# Patient Record
Sex: Male | Born: 1970 | Race: White | Hispanic: No | Marital: Married | Smoking: Former smoker
Health system: Southern US, Community
[De-identification: ages and names within clinical notes are randomized; demographics above are authoritative.]

## PROBLEM LIST (undated history)

## (undated) DIAGNOSIS — K219 Gastro-esophageal reflux disease without esophagitis: Secondary | ICD-10-CM

## (undated) DIAGNOSIS — M5430 Sciatica, unspecified side: Secondary | ICD-10-CM

## (undated) HISTORY — DX: Gastro-esophageal reflux disease without esophagitis: K21.9

## (undated) HISTORY — DX: Sciatica, unspecified side: M54.30

---

## 1998-09-26 HISTORY — PX: LUMBAR DISC SURGERY: SHX700

## 2009-09-26 HISTORY — PX: SHOULDER SURGERY: SHX246

## 2021-08-12 ENCOUNTER — Ambulatory Visit: Payer: Self-pay | Admitting: Orthopaedic Surgery

## 2021-08-26 ENCOUNTER — Other Ambulatory Visit: Payer: Self-pay

## 2021-08-26 ENCOUNTER — Ambulatory Visit: Payer: BC Managed Care – PPO | Admitting: Orthopaedic Surgery

## 2021-08-26 ENCOUNTER — Encounter: Payer: Self-pay | Admitting: Orthopaedic Surgery

## 2021-08-26 ENCOUNTER — Ambulatory Visit: Payer: Self-pay

## 2021-08-26 DIAGNOSIS — M545 Low back pain, unspecified: Secondary | ICD-10-CM

## 2021-08-26 NOTE — Progress Notes (Signed)
Office Visit Note   Patient: Bryce Choi           Date of Birth: [birth year]           MRN: [id number] Visit Date: 08/26/2021              Requested by: No referring provider defined for this encounter. PCP: No primary care provider on file.   Assessment & Plan: Visit Diagnoses:  1. Acute bilateral low back pain, unspecified whether sciatica present   2. Acute bilateral low back pain without sciatica     Plan: Mr. Bryce Choi noted rather cute onset of low back pain 3 to 4 weeks ago after lifting a portable generator.  However several days of being even began to experience some burning in both of his thighs.  His primary care physician placed him on meloxicam on 18 November and he relates that he is feeling much much better.  He is not having any discomfort at this point.  He denies any bowel or bladder changes.  His exam was benign.  Straight leg raise was negative.  Reflexes and motor exam intact.  X-rays reveal some degenerative changes at L4-5 and L5-S1 which I suspect was the cause of his problem.  Long discussion regarding use of meloxicam as needed and trying some exercises.  We will provide him with an exercise sheet.  He is asymptomatic at this point we will plan to see him back as needed  Follow-Up Instructions: Return if symptoms worsen or fail to improve.   Orders:  Orders Placed This Encounter  Procedures   XR Lumbar Spine 2-3 Views   No orders of the defined types were placed in this encounter.     Procedures: No procedures performed   Clinical Data: No additional findings.   Subjective: Chief Complaint  Patient presents with   Lower Back - Pain  Patient presents today for lower back pain. He said that he thinks it all started three weeks after moving a generator. He has been having pain in his lower back on each side. He has also experienced some "burning" in his thigh areas. He said that there has been times he has difficulty standing up straight after resting. He  started taking meloxicam 7.5mg  once daily on 08/13/2021 and has noticed that he is 90% better. He does have history of lower back surgery with Dr.Cowen in 2000.  HPI  Review of Systems   Objective: Vital Signs: There were no vitals taken for this visit.  Physical Exam Constitutional:      Appearance: He is well-developed.  Eyes:     Pupils: Pupils are equal, round, and reactive to light.  Pulmonary:     Effort: Pulmonary effort is normal.  Skin:    General: Skin is warm and dry.  Neurological:     Mental Status: He is alert and oriented to person, place, and time.  Psychiatric:        Behavior: Behavior normal.    Ortho Exam awake alert and oriented x3.  Comfortable sitting in.  No acute distress.  Walks without a limp.  Straight leg raise negative.  Reflexes symmetrical.  Motor exam intact.  Sensory exam intact.  Painless range of motion of both hips.  No percussible tenderness lumbar spine.  Tight hamstrings tendons a little bit tight and could not touch the tips of his fingers to the floor.  Specialty Comments:  No specialty comments available.  Imaging: XR Lumbar Spine 2-3 Views  Result  Date: 08/26/2021 Films lumbar spine were obtained in 2 projections.  There is a very slight left lumbar scoliosis.  There is no evidence of a listhesis on the lateral film.  There is some sclerosis of the L4-5 and L5-S1 facet joints and may be very minimal narrowing of the L5-S1 disc space.  Joints intact.  Very limited AP view of the hips did not demonstrate any arthritic changes.    PMFS History: Patient Active Problem List   Diagnosis Date Noted   Low back pain 08/26/2021   History reviewed. No pertinent past medical history.  History reviewed. No pertinent family history.  History reviewed. No pertinent surgical history. Social History   Occupational History   Not on file  Tobacco Use   Smoking status: Not on file   Smokeless tobacco: Not on file  Substance and Sexual  Activity   Alcohol use: Not on file   Drug use: Not on file   Sexual activity: Not on file

## 2021-09-01 ENCOUNTER — Encounter: Payer: Self-pay | Admitting: Orthopaedic Surgery

## 2021-09-01 ENCOUNTER — Other Ambulatory Visit: Payer: Self-pay

## 2021-09-01 DIAGNOSIS — M545 Low back pain, unspecified: Secondary | ICD-10-CM

## 2021-09-07 ENCOUNTER — Encounter: Payer: Self-pay | Admitting: Orthopaedic Surgery

## 2021-09-08 ENCOUNTER — Other Ambulatory Visit: Payer: Self-pay

## 2021-09-08 ENCOUNTER — Ambulatory Visit (INDEPENDENT_AMBULATORY_CARE_PROVIDER_SITE_OTHER): Payer: BC Managed Care – PPO | Admitting: Orthopaedic Surgery

## 2021-09-08 ENCOUNTER — Encounter: Payer: Self-pay | Admitting: Orthopaedic Surgery

## 2021-09-08 DIAGNOSIS — M545 Low back pain, unspecified: Secondary | ICD-10-CM

## 2021-09-08 NOTE — Progress Notes (Signed)
° °  Office Visit Note   Patient: Bryce Choi           Date of Birth: 11/29/1970           MRN: [id number] Visit Date: 09/08/2021              Requested by: No referring provider defined for this encounter. PCP: No primary care provider on file.   Assessment & Plan: Visit Diagnoses: No diagnosis found.  Plan: Patient is an active healthy 50 year old gentleman who presents today for follow-up on an MRI scan of his lumbar spine.  He has a previous history of Hemi laminectomy L5-S1 on the left and he believes 2000.  He was doing quite well until he was lifting a generator.  Initially he came in he did not have any symptoms at the beginning of December.  Then he had development of some burning MRI was ordered.  He comes in today denies any burning or radicular type symptoms.  He says he has pain focused in the lower central back that radiates out to his hips but goes no further.  He rates his pain at 4-5 out of 10 usually when he stands up after his been sitting a while this is present does not really get any relief or go below a 4 denies any weakness loss of bowel or bladder control.  MRI shows some facet arthropathy at all the levels of the lumbar spine.  He has had injections with Dr. Alvester Morin into his neck and he did very well.  We offered him the option of doing physical therapy or trying possibly either facet injections or epidural steroid injections with Dr. Alvester Morin.  He would like to do this.  We will make the referral  Follow-Up Instructions: No follow-ups on file.   Orders:  No orders of the defined types were placed in this encounter.  No orders of the defined types were placed in this encounter.     Procedures: No procedures performed   Clinical Data: No additional findings.   Subjective: Chief Complaint  Patient presents with   Lower Back - Follow-up    MRI review  Patient presents today for follow up on his lower back. He had an MRI and is here today for those  results.    Review of Systems  All other systems reviewed and are negative.   Objective: Vital Signs: There were no vitals taken for this visit.  Physical Exam Patient is sitting comfortably on the exam table. Ortho Exam Examination of his lumbar spine he has mild tenderness at the distal end of the lumbar spine.  Previous healed surgery and incision.  His strength is 5 out of 5 bilaterally sensation is intact he has a negative straight leg raise bilaterally. Specialty Comments:  No specialty comments available.  Imaging: No results found.   PMFS History: Patient Active Problem List   Diagnosis Date Noted   Low back pain 08/26/2021   No past medical history on file.  No family history on file.  No past surgical history on file. Social History   Occupational History   Not on file  Tobacco Use   Smoking status: Not on file   Smokeless tobacco: Not on file  Substance and Sexual Activity   Alcohol use: Not on file   Drug use: Not on file   Sexual activity: Not on file

## 2021-09-09 ENCOUNTER — Telehealth: Payer: Self-pay | Admitting: Orthopaedic Surgery

## 2021-09-09 NOTE — Telephone Encounter (Signed)
[employer] forms received. To [employer]. 

## 2021-09-13 ENCOUNTER — Encounter: Payer: Self-pay | Admitting: Orthopaedic Surgery

## 2021-09-14 ENCOUNTER — Other Ambulatory Visit: Payer: Self-pay

## 2021-09-14 ENCOUNTER — Encounter: Payer: Self-pay | Admitting: Physical Medicine and Rehabilitation

## 2021-09-14 ENCOUNTER — Encounter: Payer: Self-pay | Admitting: Orthopaedic Surgery

## 2021-09-14 ENCOUNTER — Ambulatory Visit (INDEPENDENT_AMBULATORY_CARE_PROVIDER_SITE_OTHER): Payer: BC Managed Care – PPO | Admitting: Physical Medicine and Rehabilitation

## 2021-09-14 ENCOUNTER — Telehealth: Payer: Self-pay | Admitting: Orthopaedic Surgery

## 2021-09-14 VITALS — BP 134/85 | HR 78

## 2021-09-14 DIAGNOSIS — M5416 Radiculopathy, lumbar region: Secondary | ICD-10-CM

## 2021-09-14 DIAGNOSIS — M545 Low back pain, unspecified: Secondary | ICD-10-CM

## 2021-09-14 NOTE — Progress Notes (Signed)
° °  Numeric Pain Rating Scale and Functional Assessment Average Pain 9 Pain Right Now 3 My pain is intermittent, burning, dull, and aching Pain is worse with: walking, bending, standing, some activites, and laying. Pain improves with: medication   In the last MONTH (on 0-10 scale) has pain interfered with the following?  1. General activity like being  able to carry out your everyday physical activities such as walking, climbing stairs, carrying groceries, or moving a chair?  Rating(7)  2. Relation with others like being able to carry out your usual social activities and roles such as  activities at home, at work and in your community. Rating(8)  3. Enjoyment of life such that you have  been bothered by emotional problems such as feeling anxious, depressed or irritable?  Rating(9)

## 2021-09-14 NOTE — Telephone Encounter (Signed)
Received medical records release form and $25.00 cash/forwarding to [employer] today

## 2021-09-17 NOTE — Telephone Encounter (Signed)
Referral faxed

## 2021-09-22 ENCOUNTER — Telehealth: Payer: Self-pay | Admitting: Orthopaedic Surgery

## 2021-09-22 ENCOUNTER — Encounter: Payer: Self-pay | Admitting: Orthopaedic Surgery

## 2021-09-22 NOTE — Telephone Encounter (Signed)
Refaxed order to [phone]

## 2021-09-22 NOTE — Telephone Encounter (Signed)
Ok for work note indicating no restrictions when he returns

## 2021-09-22 NOTE — Telephone Encounter (Signed)
Cassandra with Dr.Dailey's office called stating a referral was supposed to have been sent for the pt. She states they don't have the referral and would like it faxed please.   Fax# [phone] CB# [phone]

## 2021-09-22 NOTE — Telephone Encounter (Signed)
Please advise if patient will have restrictions upon returning to work 09/27/21. Lift/carry weight limit, push/pull, bend/twist, reach, climb ladder, sit, stand, walk? (I have a work tolerance form). Thanks!

## 2021-10-01 ENCOUNTER — Encounter: Payer: Self-pay | Admitting: Orthopaedic Surgery

## 2021-10-01 ENCOUNTER — Telehealth: Payer: Self-pay | Admitting: Orthopaedic Surgery

## 2021-10-01 NOTE — Telephone Encounter (Signed)
Please advise how long to allow for intermittent leave. Thanks

## 2021-10-04 NOTE — Telephone Encounter (Signed)
Completed and faxed frequency 2 times month lasting 4 days-duration 10/01/21- 03/31/22

## 2021-10-04 NOTE — Telephone Encounter (Signed)
Call patient and discuss length of time out of work either with or without restrictions

## 2021-10-12 ENCOUNTER — Ambulatory Visit: Payer: BC Managed Care – PPO | Admitting: Physician Assistant

## 2021-10-12 ENCOUNTER — Other Ambulatory Visit: Payer: Self-pay

## 2021-10-12 ENCOUNTER — Encounter: Payer: Self-pay | Admitting: Physician Assistant

## 2021-10-12 DIAGNOSIS — M5440 Lumbago with sciatica, unspecified side: Secondary | ICD-10-CM

## 2021-10-12 NOTE — Progress Notes (Signed)
Office Visit Note   Patient: Bryce Choi           Date of Birth: [birth year]           MRN: [id number] Visit Date: 10/12/2021              Requested by: Vanessa [place], FNP [address] [place],  [place] [address] PCP: Vanessa [place], FNP  Chief Complaint  Patient presents with   Lower Back - Pain      HPI: Patient is a pleasant 51 year old gentleman who has been seeing [place] for lower back pain.  He is from [place] and is in the process of getting a referral for epidural steroid injections.  He thinks they are going to call him today.  He actually state he had been doing well and gone back to working out.  He worked out very hard and 2 days later he woke up and had exacerbation of his symptoms.  They were different.  He is complaining of pain in the buttock that radiates down into his lower leg.  No loss of bowel or bladder control.  He finds it very difficult to sit for any period of time.  He states when he rolls over in bed he can relieve the symptoms or they can be exacerbated depending on what he does.  He feels a pulling going all the way down his leg if he tries to stretch.  He also has increased symptoms when he tries to bend over when he drove down here from [place] he did get some tingling in his foot that was relieved when he sat up.  He is wondering if there is something he can be given while he is waiting to get in for an epidural steroid injection.  He has had back surgery in 2006.  Denies any weakness.  Assessment & Plan: Visit Diagnoses: No diagnosis found.  Plan: Patient was told to follow-up if his symptoms progressed to weakness.  In the meantime he will contact us if he does not hear from the radiology group in the next day.  He does not want to take pain medication and he says he is taking oral steroids in the past and could not tolerate them.  We did talk about going forward with an injection of Toradol today to give him some relief.  This was  administered without difficulty We will also provide him with a note to be off work for the next 2 weeks.  He understands if he does not get relief from the injection or his symptoms worsen we will refer him to a spine surgeon Follow-Up Instructions: No follow-ups on file.   Ortho Exam  Patient is alert, oriented, no adenopathy, well-dressed, normal affect, normal respiratory effort. Examination patient are standing to sitting.  He is able to sit he has good plantar flexion dorsiflexion strength.  Sensation is intact.  He does have a positive straight leg raise  Imaging: No results found. No images are attached to the encounter.  Labs: No results found for: HGBA1C, ESRSEDRATE, CRP, LABURIC, REPTSTATUS, GRAMSTAIN, CULT, LABORGA   No results found for: ALBUMIN, PREALBUMIN, CBC  No results found for: MG No results found for: VD25OH  No results found for: PREALBUMIN No flowsheet data found.   There is no height or weight on file to calculate BMI.  Orders:  No orders of the defined types were placed in this encounter.  No orders of the defined types were placed in  this encounter.    Procedures: No procedures performed  Clinical Data: No additional findings.  ROS:  All other systems negative, except as noted in the HPI. Review of Systems  Objective: Vital Signs: There were no vitals taken for this visit.  Specialty Comments:  No specialty comments available.  PMFS History: Patient Active Problem List   Diagnosis Date Noted   Low back pain 08/26/2021   History reviewed. No pertinent past medical history.  History reviewed. No pertinent family history.  History reviewed. No pertinent surgical history. Social History   Occupational History   Not on file  Tobacco Use   Smoking status: Not on file   Smokeless tobacco: Not on file  Substance and Sexual Activity   Alcohol use: Not on file   Drug use: Not on file   Sexual activity: Not on file

## 2021-10-12 NOTE — Progress Notes (Signed)
Toradol (generic) 60 mg (2 cc of 30 mg /mL) given IM right dorsogluteal area.  FY:[employer]: [phone] Exp:12/2022

## 2021-10-16 ENCOUNTER — Encounter: Payer: Self-pay | Admitting: Orthopaedic Surgery

## 2021-10-20 ENCOUNTER — Ambulatory Visit (HOSPITAL_COMMUNITY)
Admission: RE | Admit: 2021-10-20 | Discharge: 2021-10-20 | Disposition: A | Discharge status: Home / Self Care | Payer: BC Managed Care – PPO | Source: Ambulatory Visit | Attending: Orthopaedic Surgery | Admitting: Orthopaedic Surgery

## 2021-10-20 ENCOUNTER — Other Ambulatory Visit: Payer: Self-pay

## 2021-10-20 ENCOUNTER — Encounter: Payer: Self-pay | Admitting: Orthopaedic Surgery

## 2021-10-20 ENCOUNTER — Ambulatory Visit (INDEPENDENT_AMBULATORY_CARE_PROVIDER_SITE_OTHER): Payer: BC Managed Care – PPO | Admitting: Orthopaedic Surgery

## 2021-10-20 DIAGNOSIS — M5440 Lumbago with sciatica, unspecified side: Secondary | ICD-10-CM | POA: Diagnosis present

## 2021-10-20 NOTE — Progress Notes (Signed)
Office Visit Note   Patient: Bryce Choi           Date of Birth: 11/16/1970           MRN: [id number] Visit Date: 10/20/2021              Requested by: Vanessa [place], FNP [address] [place],  [place] [address] PCP: Vanessa [place], FNP   Assessment & Plan: Visit Diagnoses:  1. Acute right-sided low back pain with sciatica, sciatica laterality unspecified     Plan: Mr Mundie has been followed recently for the problem referable to his lumbar spine.  His past history is significant that he had a lumbar laminectomy over 20 years ago for what sounded like a ruptured disc on the left.  He is done well until just recently when he had a problem after lifting a heavy generator.  Initially the pain was localized to his back.  He had an MRI scan that demonstrated some degenerative changes.  There was no evidence of radiculopathy.  He felt better and even returned to work only to have an exacerbation of his pain predominantly with right lower extremity radiculopathy.  He is not had any bowel or bladder changes.  He has had some pain in his right calf and some tingling into his toes.  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  This did not help.  He is also been on gabapentin up to 200 mg at night as he has had trouble sleeping.  He has not noticed any loss of feeling or weakness in his right leg but he does have trouble sleeping.  Sitting is a problem.  HE DOES have some tenderness over the greater trochanter of his right hip and no pain with range of motion of the hip along the groin but definitely has a straight leg raise is positive.  Reflexes are symmetrical and motor or sensory exam intact.  At this point I think we need to repeat the MRI scan as it sure seems that he has nerve root irritation on the right.  I would also urged him to call his pain physician in [place] as I think he needs to  have an epidural steroid injection with if he receives the [facility] before the MRI scan and is better we will cancel the MRI scan release we will try to get approval based on the change of his symptoms that he did not want pain medicines or Medrol Dosepak.  He is not working and has been approved to stay out of work until early February.  He can increase his gabapentin to 300 mg at night.  So far has not had any side effects.  He will continue with the ibuprofen 800 mg 3 times a day as needed with food  Follow-Up Instructions: Return in about 2 weeks (around 11/03/2021).   Orders:  Orders Placed This Encounter  Procedures   MR Lumbar Spine w/o contrast   No orders of the defined types were placed in this encounter.     Procedures: No procedures performed   Clinical Data: No additional findings.   Subjective: Chief Complaint  Patient presents with   Lower Back - Pain  Patient presents today for lower back pain. He is now having right leg pain. He cannot sleep or sit. He saw MaryAnne last week and received a toradol injection. He  said that the toradol did not help. He also saw pain management last Thursday in [place].  His symptoms have changed over the last 10 to 12 days with predominantly right lower extremity discomfort.  He has had some pain in his right thigh and calf and tingling into the right foot.  No bowel or bladder changes.  No symptoms referable to the left lower extremity.  Not having much back pain  HPI  Review of Systems   Objective: Vital Signs: There were no vitals taken for this visit.  Physical Exam Constitutional:      Appearance: He is well-developed.  Eyes:     Pupils: Pupils are equal, round, and reactive to light.  Pulmonary:     Effort: Pulmonary effort is normal.  Skin:    General: Skin is warm and dry.  Neurological:     Mental Status: He is alert and oriented to person, place, and time.  Psychiatric:        Behavior: Behavior normal.    Ortho  Exam awake and alert and oriented x3.  He was in some discomfort particularly when he would sit.  Straight leg raise is negative on the left but it was definitely positive on the right for posterior thigh and calf pain.  Sensory exam was intact but he notes he does have some constant tingling in his right foot.  There was some tenderness over the greater trochanter of his right hip but no masses.  No buttock pain.  No percussible back pain.  Reflexes symmetrical at both knees and I thought decreased in the left ankle but again no symptoms referable to the left lower extremity.  This might be related to his old back surgery.  Motor exam intact  Specialty Comments:  No specialty comments available.  Imaging: No results found.   PMFS History: Patient Active Problem List   Diagnosis Date Noted   Low back pain 08/26/2021   History reviewed. No pertinent past medical history.  History reviewed. No pertinent family history.  History reviewed. No pertinent surgical history. Social History   Occupational History   Not on file  Tobacco Use   Smoking status: Not on file   Smokeless tobacco: Not on file  Substance and Sexual Activity   Alcohol use: Not on file   Drug use: Not on file   Sexual activity: Not on file

## 2021-10-21 NOTE — Telephone Encounter (Signed)
Please extend out of work note until Feb 15

## 2021-10-28 ENCOUNTER — Encounter: Payer: Self-pay | Admitting: Orthopaedic Surgery

## 2021-11-03 ENCOUNTER — Other Ambulatory Visit: Payer: Self-pay

## 2021-11-03 ENCOUNTER — Encounter: Payer: Self-pay | Admitting: Orthopaedic Surgery

## 2021-11-03 ENCOUNTER — Ambulatory Visit (INDEPENDENT_AMBULATORY_CARE_PROVIDER_SITE_OTHER): Payer: BC Managed Care – PPO | Admitting: Orthopaedic Surgery

## 2021-11-03 DIAGNOSIS — M5440 Lumbago with sciatica, unspecified side: Secondary | ICD-10-CM

## 2021-11-03 NOTE — Progress Notes (Signed)
Office Visit Note   Patient: Bryce Choi           Date of Birth: 07-23-1971           MRN: [id number] Visit Date: 11/03/2021              Requested by: Vanessa [place], FNP [address] [place],  [place] [address] PCP: Vanessa [place], FNP   Assessment & Plan: Visit Diagnoses:  1. Acute right-sided low back pain with sciatica, sciatica laterality unspecified     Plan: Mr.Vivero had a second MRI scan of his lumbar spine performed on January 25.  This demonstrated shallow central disc protrusions at L2-3 and L3-4 without stenosis.  There was also shallow central disc protrusion and facet hypertrophy at L4-5 with mild subarticular stenosis bilaterally.  There was a prior left hemilaminectomy at L5-S1 but negative for neural impingement or recurrent disc.  I had the repeat MRI scan because he was experiencing significant back and left lower extremity pain associated with thigh and calf discomfort and tingling into his foot may be a bit of weakness of dorsiflexion of his toes and foot.  In the interim he has been seen by a physician in [place] and had an epidural steroid injection notes that he is at least 60% better.  He does not have the severe pain in his back or his legs but still has a bit of tingling in his toes and he feels like he is still little bit weaker.  Today's straight leg raise was negative.  I thought he was a little bit weaker with dorsiflexion of his foot and toes then on the left side but reflexes were symmetrical except for decreased left ankle reflex which may be result of his old left L5-S1 hemilaminectomy.  Overall he is definitely better.  He does work at [employer] and does a lot of bending stooping squatting and lifting.  I do not think he can do that as yet.  He has a follow-up exam with his [facility] physician in 2 days and might be considered for another epidural steroid injection.  He should continue to stay out of work until he is reevaluated by me over the next  3 to 4 weeks.  Hopefully he will continue to improve but if not I will have him see one of the spine surgeons.  I did offer offered evaluation by Dr. Ophelia Charter.  He is doing well at this point with Advil and Tylenol.  No bowel or bladder changes  Follow-Up Instructions: Return in about 1 month (around 12/01/2021).   Orders:  No orders of the defined types were placed in this encounter.  No orders of the defined types were placed in this encounter.     Procedures: No procedures performed   Clinical Data: No additional findings.   Subjective: Chief Complaint  Patient presents with   Lower Back - Pain, Follow-up  Patient presents today for a two week follow up on his lower back. He had an MRI and got those results over the phone. He states that since his injection almost two weeks ago he has noticed almost 60% improvement. He is taking Ibuprofen as needed. He states that he still has right foot numbness. He also still has some right calf and thigh pain.  HPI  Review of Systems   Objective: Vital Signs: There were no vitals taken for this visit.  Physical Exam Constitutional:      Appearance: He is well-developed.  Eyes:  Pupils: Pupils are equal, round, and reactive to light.  Pulmonary:     Effort: Pulmonary effort is normal.  Skin:    General: Skin is warm and dry.  Neurological:     Mental Status: He is alert and oriented to person, place, and time.  Psychiatric:        Behavior: Behavior normal.    Ortho Exam awake alert and oriented x3.  Comfortable sitting.  No acute distress.  Straight leg raise was negative bilaterally for significant back pain and was just a little bit of tightness in the the right hamstring.  Reflexes were normal on the right side instead the left ankle reflex was decreased.  This could be chronic from his old L5-S1 hemilaminectomy on the left.  I thought he was a little bit weak with dorsiflexion of his right foot.  He has a bit of tingling in his  toes but otherwise sensory exam is intact.  No percussible tenderness of his lumbar spine.  Specialty Comments:  No specialty comments available.  Imaging: No results found.   PMFS History: Patient Active Problem List   Diagnosis Date Noted   Low back pain 08/26/2021   History reviewed. No pertinent past medical history.  History reviewed. No pertinent family history.  History reviewed. No pertinent surgical history. Social History   Occupational History   Not on file  Tobacco Use   Smoking status: Not on file   Smokeless tobacco: Not on file  Substance and Sexual Activity   Alcohol use: Not on file   Drug use: Not on file   Sexual activity: Not on file

## 2021-11-16 ENCOUNTER — Encounter: Payer: Self-pay | Admitting: Orthopaedic Surgery

## 2021-11-16 NOTE — Telephone Encounter (Signed)
Ok to extend out of work until March 20 as indicated

## 2021-12-01 ENCOUNTER — Ambulatory Visit (INDEPENDENT_AMBULATORY_CARE_PROVIDER_SITE_OTHER): Payer: BC Managed Care – PPO | Admitting: Orthopaedic Surgery

## 2021-12-01 ENCOUNTER — Other Ambulatory Visit: Payer: Self-pay

## 2021-12-01 ENCOUNTER — Encounter: Payer: Self-pay | Admitting: Orthopaedic Surgery

## 2021-12-01 DIAGNOSIS — M5440 Lumbago with sciatica, unspecified side: Secondary | ICD-10-CM | POA: Diagnosis not present

## 2021-12-01 NOTE — Progress Notes (Signed)
? ?Office Visit Note ?  ?Patient: Bryce Choi           ?Date of Birth: 1970-12-07           ?MRN: [id number] ?Visit Date: 12/01/2021 ?             ?Requested by: Vanessa Willey, FNP ?[phone] ?[place],  [place] [address] ?PCP: Vanessa [place], FNP ? ? ?Assessment & Plan: ?Visit Diagnoses:  ?1. Acute right-sided low back pain with sciatica, sciatica laterality unspecified   ? ? ?Plan: Feeling much better after second epidural steroid injection.  The third is scheduled for this Friday in [place].  He feels like he is at least 80% better.  Little if any discomfort in his right lower extremity.  Never had any problem on the left.  Still has a little residual tingling in the toes of his right foot but otherwise is doing well.  He is scheduled to return to work on 20 March and feels like he is "okay".  His biggest fear is that the pain may recur.  Neurologically intact except for decreased left ankle reflex motor exam intact.  No bowel or bladder changes.  He is on meloxicam.  Urged him to work on his back exercises and return as needed.  He can always follow-up with the physiatrist in [place].  Also can consider spine surgeon evaluation in the future ? ?Follow-Up Instructions: Return if symptoms worsen or fail to improve.  ? ?Orders:  ?No orders of the defined types were placed in this encounter. ? ?No orders of the defined types were placed in this encounter. ? ? ? ? Procedures: ?No procedures performed ? ? ?Clinical Data: ?No additional findings. ? ? ?Subjective: ?Chief Complaint  ?Patient presents with  ? Lower Back - Follow-up  ?Patient presents today for follow up on his lower back and leg pain. He has had two ESI thus far. He is scheduled for the third this week. He states that he is doing much better. He is scheduled to go back to work on the 20th and feels up to it. No complaints today except some lingering numbness in his toes.  ? ?HPI ? ?Review of Systems ? ? ?Objective: ?Vital Signs: There were no  vitals taken for this visit. ? ?Physical Exam ?Constitutional:   ?   Appearance: He is well-developed.  ?Eyes:  ?   Pupils: Pupils are equal, round, and reactive to light.  ?Pulmonary:  ?   Effort: Pulmonary effort is normal.  ?Skin: ?   General: Skin is warm and dry.  ?Neurological:  ?   Mental Status: He is alert and oriented to person, place, and time.  ?Psychiatric:     ?   Behavior: Behavior normal.  ? ? ?Ortho Exam awake alert and oriented x3.  Comfortable sitting.  Straight leg raise negative.  Reflexes symmetrical both knees and slightly decreased left ankle.  Motor exam appeared to be intact.  Walks without a limp.  No percussible tenderness of lumbar spine ? ?Specialty Comments:  ?No specialty comments available. ? ?Imaging: ?No results found. ? ? ?PMFS History: ?Patient Active Problem List  ? Diagnosis Date Noted  ? Low back pain 08/26/2021  ? ?History reviewed. No pertinent past medical history.  ?History reviewed. No pertinent family history.  ?History reviewed. No pertinent surgical history. ?Social History  ? ?Occupational History  ? Not on file  ?Tobacco Use  ? Smoking status: Not on file  ? Smokeless tobacco:  Not on file  ?Substance and Sexual Activity  ? Alcohol use: Not on file  ? Drug use: Not on file  ? Sexual activity: Not on file  ? ? ? ? ? ? ?

## 2021-12-08 ENCOUNTER — Ambulatory Visit: Payer: BC Managed Care – PPO | Admitting: Orthopaedic Surgery

## 2021-12-12 ENCOUNTER — Encounter: Payer: Self-pay | Admitting: Orthopaedic Surgery

## 2022-05-02 ENCOUNTER — Telehealth: Payer: Self-pay | Admitting: Orthopaedic Surgery

## 2022-05-02 NOTE — Telephone Encounter (Signed)
Called patient left message to return call and schedule an appointment with Dr. Cleophas Dunker or [place] for his back  per [facility] message

## 2022-05-04 ENCOUNTER — Ambulatory Visit: Payer: BC Managed Care – PPO | Admitting: Physician Assistant

## 2022-05-23 ENCOUNTER — Encounter: Payer: Self-pay | Admitting: Orthopaedic Surgery

## 2022-05-25 NOTE — Telephone Encounter (Signed)
Need to extend the FMLA for another 6 months

## 2022-05-25 NOTE — Telephone Encounter (Signed)
Done. Note in chart

## 2022-05-25 NOTE — Telephone Encounter (Signed)
Note in chart 

## 2022-06-02 ENCOUNTER — Encounter: Payer: Self-pay | Admitting: Nurse Practitioner

## 2022-06-07 ENCOUNTER — Telehealth: Payer: Self-pay | Admitting: Orthopaedic Surgery

## 2022-06-07 NOTE — Telephone Encounter (Signed)
[employer] forms received. To [employer]. 

## 2022-06-09 NOTE — Telephone Encounter (Signed)
Can you review this and tell me if the pt needs to be seen in the office again with you or Dr. Cleophas Dunker please

## 2022-06-09 NOTE — Telephone Encounter (Signed)
The intermittent fmla is going to be denied if we dont provide documentation. I am not sure our notes support this. Can you please review to make sure I am no missing something

## 2022-07-04 ENCOUNTER — Other Ambulatory Visit (INDEPENDENT_AMBULATORY_CARE_PROVIDER_SITE_OTHER): Payer: BC Managed Care – PPO

## 2022-07-04 ENCOUNTER — Ambulatory Visit: Payer: BC Managed Care – PPO | Admitting: Nurse Practitioner

## 2022-07-04 ENCOUNTER — Encounter: Payer: Self-pay | Admitting: Nurse Practitioner

## 2022-07-04 VITALS — BP 100/64 | HR 56 | Ht 69.29 in | Wt 173.0 lb

## 2022-07-04 DIAGNOSIS — K219 Gastro-esophageal reflux disease without esophagitis: Secondary | ICD-10-CM | POA: Diagnosis not present

## 2022-07-04 DIAGNOSIS — R109 Unspecified abdominal pain: Secondary | ICD-10-CM

## 2022-07-04 LAB — CBC WITH DIFFERENTIAL/PLATELET
Basophils Absolute: 0 10*3/uL (ref 0.0–0.1)
Basophils Relative: 0.9 % (ref 0.0–3.0)
Eosinophils Absolute: 0 10*3/uL (ref 0.0–0.7)
Eosinophils Relative: 0.9 % (ref 0.0–5.0)
HCT: 41.2 % (ref 39.0–52.0)
Hemoglobin: 13.8 g/dL (ref 13.0–17.0)
Lymphocytes Relative: 31.7 % (ref 12.0–46.0)
Lymphs Abs: 1.8 10*3/uL (ref 0.7–4.0)
MCHC: 33.5 g/dL (ref 30.0–36.0)
MCV: 89.1 fl (ref 78.0–100.0)
Monocytes Absolute: 0.2 10*3/uL (ref 0.1–1.0)
Monocytes Relative: 4 % (ref 3.0–12.0)
Neutro Abs: 3.5 10*3/uL (ref 1.4–7.7)
Neutrophils Relative %: 62.5 % (ref 43.0–77.0)
Platelets: 163 10*3/uL (ref 150.0–400.0)
RBC: 4.62 Mil/uL (ref 4.22–5.81)
RDW: 15.3 % (ref 11.5–15.5)
WBC: 5.5 10*3/uL (ref 4.0–10.5)

## 2022-07-04 LAB — COMPREHENSIVE METABOLIC PANEL
ALT: 19 U/L (ref 0–53)
AST: 19 U/L (ref 0–37)
Albumin: 4.4 g/dL (ref 3.5–5.2)
Alkaline Phosphatase: 36 U/L — ABNORMAL LOW (ref 39–117)
BUN: 25 mg/dL — ABNORMAL HIGH (ref 6–23)
CO2: 28 mEq/L (ref 19–32)
Calcium: 9.3 mg/dL (ref 8.4–10.5)
Chloride: 103 mEq/L (ref 96–112)
Creatinine, Ser: 1.06 mg/dL (ref 0.40–1.50)
GFR: 81.48 mL/min (ref >60.00)
Glucose, Bld: 91 mg/dL (ref 70–99)
Potassium: 4.1 mEq/L (ref 3.5–5.1)
Sodium: 138 mEq/L (ref 135–145)
Total Bilirubin: 0.6 mg/dL (ref 0.2–1.2)
Total Protein: 7.1 g/dL (ref 6.0–8.3)

## 2022-07-04 NOTE — Patient Instructions (Addendum)
You have been scheduled for an endoscopy. Please follow written instructions given to you at your visit today. If you use inhalers (even only as needed), please bring them with you on the day of your procedure.  We have provided you with a GERD handout to read and follow.  We are requesting a copy of your cologard report for your records.  Your provider has requested that you go to the basement level for lab work before leaving today. Press "B" on the elevator. The lab is located at the first door on the left as you exit the elevator.  Due to recent changes in healthcare laws, you may see the results of your imaging and laboratory studies on MyChart before your provider has had a chance to review them.  We understand that in some cases there may be results that are confusing or concerning to you. Not all laboratory results come back in the same time frame and the provider may be waiting for multiple results in order to interpret others.  Please give [place] 48 hours in order for your provider to thoroughly review all the results before contacting the office for clarification of your results.   I appreciate the opportunity to care for you. Carl Best, CRNP

## 2022-07-04 NOTE — Progress Notes (Signed)
07/04/2022 Vickey Huger [id number] 1970/11/30   CHIEF COMPLAINT: Heartburn, abdominal bloating   HISTORY OF PRESENT ILLNESS: Porter Moes is a 51 year old male with a past medical history of lower back pain and GERD.  S/P back surgery in 2000 and shoulder surgery in 2011 or 2012. He presents to our office today self referred for further evaluation regarding increased heartburn x 6 to 8 weeks with intermittent upper abdominal bloat and burping.  No dysphagia.  He has a history of GERD symptoms for which he takes Omeprazole 40mg  daily x 10 to 15 years.  He takes Famotidine 20 mg nightly for the past year.  He underwent an EGD by Dr in [place] approximately 15 years ago, no significant findings. He has a history of obesity, weighed 280lbs 10 or 12 years ago. He changed his diet habits and became a triathlete.  His weight today is 173lbs with a BMI of 25.  He started taking Meloxicam for right leg pain January 2023 then discontinued it 2 to 3 months ago.  He started taking Ibuprofen 800 mg once daily for the past 1-1/2 weeks due to having localized left rib cage pain.  His left-sided pain is worse when he lays on his left side and is tender when he touches this area.  No dysuria or hematuria.  No cough or shortness of breath.  He worked in the yard a few weeks ago and wonders if this triggered his left-sided pain.  He is passing a normal formed brown bowel movement daily.  He denies feeling constipated.  No rectal bleeding or black stools.  He underwent a Cologuard test 1 year ago which she reported was negative.  No known family history of colorectal cancer.   Social History: He is married.  He has 1 son.  He is a February 2023.  Past smoker.  No alcohol use.  Family History: No known family history of esophageal, gastric or colon cancer.   Allergies  Allergen Reactions   Penicillins Rash      Outpatient Encounter Medications as of 07/04/2022  Medication Sig   Meloxicam 15 MG TBDP     omeprazole (PRILOSEC) 40 MG capsule omeprazole 40 mg capsule,delayed release  Take 1 capsule every day by oral route for 90 days.   No facility-administered encounter medications on file as of 07/04/2022.    REVIEW OF SYSTEMS:  Gen: Denies fever, sweats or chills. No weight loss.  CV: Denies chest pain, palpitations or edema. Resp: Denies cough, shortness of breath of hemoptysis.  GI: See HPI.   GU : Denies urinary burning, blood in urine, increased urinary frequency or incontinence. MS: Denies joint pain, muscles aches or weakness. Derm: + Back pain. Psych: Denies depression, anxiety or memory loss. Heme: Denies bruising, easy bleeding. Neuro:  Denies headaches, dizziness or paresthesias. Endo:  Denies any problems with DM, thyroid or adrenal function.  PHYSICAL EXAM: BP 100/64 (BP Location: Left Arm, Patient Position: Sitting, Cuff Size: Normal)   Pulse (!) 56   Ht 5' 9.29" (1.76 m) Comment: height measured without shoes  Wt 173 lb (78.5 kg)   BMI 25.33 kg/m   General: 51 year old male in no acute distress. Head: Normocephalic and atraumatic. Eyes:  Sclerae non-icteric, conjunctive pink. Ears: Normal auditory acuity. Mouth: Dentition intact. No ulcers or lesions.  Neck: Supple, no lymphadenopathy or thyromegaly.  Lungs: Clear bilaterally to auscultation without wheezes, crackles or rhonchi. Heart: Regular rate and rhythm. No murmur, rub or gallop  appreciated.  Abdomen: Soft, nondistended. Localized tenderness to the 10-11 left ribs and costal margin. No CVA tenderness. No masses. No hepatosplenomegaly. Normoactive bowel sounds x 4 quadrants.  Rectal: Deferred. Musculoskeletal: Symmetrical with no gross deformities. Skin: Warm and dry. No rash or lesions on visible extremities. Extremities: No edema. Neurological: Alert oriented x 4, no focal deficits.  Psychological:  Alert and cooperative. Normal mood and affect.  ASSESSMENT AND PLAN:  64) 51 year old male with a  history of GERD with increased heartburn and burping with upper abdominal bloat x 6 to 8 weeks. On Omeprazole 40mg  Qam and Famotidine 20mg  QD. -EGD to rule out reflux esophagitis/Barrett's esophagus, benefits and risks discussed including risk with sedation, risk of bleeding, perforation and infection  -GERD handout  -Continue Omeprazole 40 mg every morning and Famotidine 40 mg p.o. nightly for now, further PPI recommendations to be determined after EGD completed  2) Colon cancer screening.  Patient reported undergoing a negative Cologuard 1 year ago.  No known family history of colon polyps or colon cancer. -Request copy of Cologuard test  3) Left costal pain, likely costochondritis -CBC, CMP -Limit ibuprofen use in the setting of active GERD symptoms -Follow-up with PCP -Consider chest x-ray and CTAP if pain persists   CC:  Frances Maywood, FNP

## 2022-07-04 NOTE — Progress Notes (Signed)
____________________________________________________________  Attending physician addendum:  Thank you for sending this case to me. I have reviewed the entire note and agree with the plan.  He is on my endoscopy schedule for 07/05/22  Wilfrid Lund, MD  ____________________________________________________________

## 2022-07-05 ENCOUNTER — Ambulatory Visit (AMBULATORY_SURGERY_CENTER): Payer: BC Managed Care – PPO | Admitting: Gastroenterology

## 2022-07-05 ENCOUNTER — Encounter: Payer: Self-pay | Admitting: Gastroenterology

## 2022-07-05 VITALS — BP 102/65 | HR 42 | Temp 98.9°F | Resp 18 | Ht 69.0 in | Wt 173.0 lb

## 2022-07-05 DIAGNOSIS — K2950 Unspecified chronic gastritis without bleeding: Secondary | ICD-10-CM

## 2022-07-05 DIAGNOSIS — R12 Heartburn: Secondary | ICD-10-CM

## 2022-07-05 DIAGNOSIS — K2289 Other specified disease of esophagus: Secondary | ICD-10-CM

## 2022-07-05 DIAGNOSIS — R140 Abdominal distension (gaseous): Secondary | ICD-10-CM

## 2022-07-05 DIAGNOSIS — K229 Disease of esophagus, unspecified: Secondary | ICD-10-CM | POA: Diagnosis not present

## 2022-07-05 DIAGNOSIS — K219 Gastro-esophageal reflux disease without esophagitis: Secondary | ICD-10-CM

## 2022-07-05 DIAGNOSIS — R109 Unspecified abdominal pain: Secondary | ICD-10-CM

## 2022-07-05 MED ORDER — SODIUM CHLORIDE 0.9 % IV SOLN: 500.0000 mL | Freq: Once | INTRAVENOUS | Status: DC

## 2022-07-05 NOTE — Op Note (Signed)
[facility] Patient Name: Bryce Choi Procedure Date: 07/05/2022 1:58 PM MRN: [id number] Endoscopist: Sherilyn Cooter L. Myrtie Neither , MD Age: 51 Referring MD:  Date of Birth: [birth year] Gender: Male Account #: [id number] Procedure:                Upper GI endoscopy Indications:              Heartburn, Screening for Barrett's esophagus in                            patient at risk for this condition, Abdominal                            bloating (months duration - no altered bowel habits                            or rectal bleeding. No vomiting, dysphagia, weight                            loss) Medicines:                Monitored Anesthesia Care Procedure:                Pre-Anesthesia Assessment:                           - Prior to the procedure, a History and Physical                            was performed, and patient medications and                            allergies were reviewed. The patient's tolerance of                            previous anesthesia was also reviewed. The risks                            and benefits of the procedure and the sedation                            options and risks were discussed with the patient.                            All questions were answered, and informed consent                            was obtained. Prior Anticoagulants: The patient has                            taken no previous anticoagulant or antiplatelet                            agents. ASA Grade Assessment: II - A patient with  mild systemic disease. After reviewing the risks                            and benefits, the patient was deemed in                            satisfactory condition to undergo the procedure.                           After obtaining informed consent, the endoscope was                            passed under direct vision. Throughout the                            procedure, the patient's blood pressure, pulse, and                             oxygen saturations were monitored continuously. The                            GIF [id number] #[id number] was introduced through the                            mouth, and advanced to the second part of duodenum.                            The upper GI endoscopy was accomplished without                            difficulty. The patient tolerated the procedure                            well. Scope In: Scope Out: 2:15:41 PM Findings:                 Scattered islands (3 diminutive) of salmon-colored                            mucosa were present. No other visible abnormalities                            were present under WL, NBI and Magnification. The                            maximum longitudinal extent of these esophageal                            mucosal changes was 2 cm in length. Biopsies were                            taken with a cold forceps for histology. (Jar 3)  Additional Bx taken from another area in the distal                            esophageal mucosa - which was normal and without                            esophagitis. (Jar 4)                           The entire examined stomach was normal. Biopsies                            were taken with a cold forceps for histology                            (antrum and body). (Jar 2)                           The cardia and gastric fundus were normal on                            retroflexion.                           The examined duodenum was normal. Biopsies for                            histology were taken with a cold forceps for                            evaluation of celiac disease. (Jar 1) Complications:            No immediate complications. Estimated Blood Loss:     Estimated blood loss was minimal. Impression:               - Salmon-colored mucosa. Biopsied.                           - Normal stomach. Biopsied.                           - Normal examined duodenum.  Biopsied. Recommendation:           - Patient has a contact number available for                            emergencies. The signs and symptoms of potential                            delayed complications were discussed with the                            patient. Return to normal activities tomorrow.                            Written discharge instructions were provided to the  patient.                           - Resume previous diet.                           - Continue present medications.                           - Await pathology results. Kwali Wrinkle L. Loletha Carrow, MD 07/05/2022 2:25:34 PM This report has been signed electronically.

## 2022-07-05 NOTE — Progress Notes (Signed)
Pt's states no medical or surgical changes since previsit or office visit. 

## 2022-07-05 NOTE — Progress Notes (Signed)
Called to room to assist during endoscopic procedure.  Patient ID and intended procedure confirmed with present staff. Received instructions for my participation in the procedure from the performing physician.  

## 2022-07-05 NOTE — Progress Notes (Signed)
Report given to PACU, vss 

## 2022-07-05 NOTE — Patient Instructions (Signed)
Continue present medications.  Await pathology results.   YOU HAD AN ENDOSCOPIC PROCEDURE TODAY AT [facility]:   Refer to the procedure report that was given to you for any specific questions about what was found during the examination.  If the procedure report does not answer your questions, please call your gastroenterologist to clarify.  If you requested that your care partner not be given the details of your procedure findings, then the procedure report has been included in a sealed envelope for you to review at your convenience later.  YOU SHOULD EXPECT: Some feelings of bloating in the abdomen. Passage of more gas than usual.  Walking can help get rid of the air that was put into your GI tract during the procedure and reduce the bloating. If you had a lower endoscopy (such as a colonoscopy or flexible sigmoidoscopy) you may notice spotting of blood in your stool or on the toilet paper. If you underwent a bowel prep for your procedure, you may not have a normal bowel movement for a few days.  Please Note:  You might notice some irritation and congestion in your nose or some drainage.  This is from the oxygen used during your procedure.  There is no need for concern and it should clear up in a day or so.  SYMPTOMS TO REPORT IMMEDIATELY:  Following upper endoscopy (EGD)  Vomiting of blood or coffee ground material  New chest pain or pain under the shoulder blades  Painful or persistently difficult swallowing  New shortness of breath  Fever of 100F or higher  Black, tarry-looking stools  For urgent or emergent issues, a gastroenterologist can be reached at any hour by calling [phone]. Do not use MyChart messaging for urgent concerns.    DIET:  We do recommend a small meal at first, but then you may proceed to your regular diet.  Drink plenty of fluids but you should avoid alcoholic beverages for 24 hours.  ACTIVITY:  You should plan to take it easy for the rest of  today and you should NOT DRIVE or use heavy machinery until tomorrow (because of the sedation medicines used during the test).    FOLLOW UP: Our staff will call the number listed on your records the next business day following your procedure.  We will call around 7:15- 8:00 am to check on you and address any questions or concerns that you may have regarding the information given to you following your procedure. If we do not reach you, we will leave a message.     If any biopsies were taken you will be contacted by phone or by letter within the next 1-3 weeks.  Please call us at [phone] if you have not heard about the biopsies in 3 weeks.    SIGNATURES/CONFIDENTIALITY: You and/or your care partner have signed paperwork which will be entered into your electronic medical record.  These signatures attest to the fact that that the information above on your After Visit Summary has been reviewed and is understood.  Full responsibility of the confidentiality of this discharge information lies with you and/or your care-partner.

## 2022-07-05 NOTE — Progress Notes (Signed)
No changes to clinical history since GI office visit on 07/04/22.  The patient is appropriate for an endoscopic procedure in the ambulatory setting.  - Maclin Guerrette Danis, MD    

## 2022-07-05 NOTE — Progress Notes (Signed)
1357 Robinul 0.1 mg IV given due large amount of secretions upon assessment.  MD made aware, vss 

## 2022-07-06 ENCOUNTER — Telehealth: Payer: Self-pay | Admitting: *Deleted

## 2022-07-06 NOTE — Telephone Encounter (Signed)
Attempted f/u phone call. No answer. Unable to leave message.  ?

## 2022-07-07 ENCOUNTER — Telehealth: Payer: Self-pay | Admitting: Gastroenterology

## 2022-07-07 NOTE — Telephone Encounter (Signed)
Received a MyChart message from patient requesting that he be scheduled for a CT scan for the pain in his left side.  He said it seems to be getting worse.  Please call patient and advise.  Thank you.

## 2022-07-07 NOTE — Telephone Encounter (Signed)
Returned call to patient. We reviewed Dr. Loletha Carrow' recommendations. Pt states that he was under the impression that Jaclyn Shaggy was going to order if symptoms were persistent. I offered to send her a message as well pt. Pt stated that he was not going to worry about it if she was going to tell him the same thing. Pt is aware that we will wait for pathology results. Pt verbalized understanding and had no concerns at the end of the call.

## 2022-07-07 NOTE — Telephone Encounter (Signed)
See 10/12 telephone encounter for details.

## 2022-07-07 NOTE — Telephone Encounter (Signed)
While he has bloating bloating, the pain he is referring to is left sided chest wall pain based on my examination of him.  I discussed that with him and his wife after the procedure.  I am waiting for the results of his biopsies before making a decision on abdominal imaging.  He needs to contact and see his PCP for this musculoskeletal chest wall pain.  - HD

## 2022-07-08 ENCOUNTER — Encounter: Payer: BC Managed Care – PPO | Admitting: Gastroenterology

## 2022-07-12 ENCOUNTER — Encounter: Payer: Self-pay | Admitting: Gastroenterology

## 2022-07-15 ENCOUNTER — Other Ambulatory Visit: Payer: Self-pay

## 2022-07-15 DIAGNOSIS — R109 Unspecified abdominal pain: Secondary | ICD-10-CM

## 2022-07-15 DIAGNOSIS — R140 Abdominal distension (gaseous): Secondary | ICD-10-CM

## 2022-07-20 NOTE — Addendum Note (Signed)
Addended by: Yevette Edwards on: 07/20/2022 08:41 AM   Modules accepted: Orders

## 2022-07-21 NOTE — Telephone Encounter (Signed)
This has been addressed. See 10/12 telephone encounter for details.

## 2022-07-21 NOTE — Telephone Encounter (Signed)
CT was approved. CT order has been faxed to [facility] (P: [phone], [phone]).  Order ID: [id number]       Completed Approval Valid Through: 07/20/2022 - 08/18/2022  Returned call to patient and let him know that we had to wait for authorization before we could fax CT order to [facility]. Pt is aware that he can contact [facility] later today to schedule his CT scan. Pt stated that at the time of his office visit Jaclyn Shaggy mentioned possibly changing medications after EGD since he has been on them for years. Please advise, thanks.

## 2022-07-21 NOTE — Telephone Encounter (Signed)
Inbound call from patient. Please give patient a call back to further advise.

## 2022-07-22 MED ORDER — PANTOPRAZOLE SODIUM 40 MG PO TBEC: DELAYED_RELEASE_TABLET | ORAL | 1 refills | Status: DC

## 2022-07-22 NOTE — Telephone Encounter (Signed)
Called and spoke with patient regarding Dr. Loletha Carrow' recommendations. Pt knows to discontinue Omeprazole and Famotidine, and to begin Protonix 40 mg BID before meals. Pt states that if this works he will request additional refills be sent to his mail order pharmacy. Pt had no concerns at the end of the call.

## 2022-07-22 NOTE — Addendum Note (Signed)
Addended by: Yevette Edwards on: 07/22/2022 01:32 PM   Modules accepted: Orders

## 2022-07-22 NOTE — Telephone Encounter (Signed)
I do not know if a different PPI will control his heartburn any better, but we can certainly try.  Instead of omeprazole and famotidine:  Pantoprazole 40 mg twice daily before breakfast and supper Disp#60   RF 1  - HD

## 2022-07-25 ENCOUNTER — Telehealth: Payer: Self-pay | Admitting: Pharmacy Technician

## 2022-07-25 ENCOUNTER — Other Ambulatory Visit (HOSPITAL_COMMUNITY): Payer: Self-pay

## 2022-07-25 NOTE — Telephone Encounter (Signed)
Patient Advocate Encounter  Received notification from [employer] that prior authorization for PANTOPRAZOLE 40MG  is required.   PA submitted on 10.30.23 Key [id number] Status is pending    Luciano Cutter, CPhT Patient Advocate Phone: [phone]

## 2022-07-25 NOTE — Telephone Encounter (Signed)
PA has been submitted and telephone encounter has been created 

## 2022-08-01 ENCOUNTER — Telehealth: Payer: Self-pay | Admitting: Gastroenterology

## 2022-08-01 NOTE — Telephone Encounter (Signed)
Pharmacy Patient Advocate Encounter  Prior Authorization for Pantoprazole 40 mg has been approved.    PA# [id number] Effective dates: 07/25/2022 through 07/26/2023   Joneen Boers, [place] Patient Advocate Specialist [facility] Patient Advocate Team Direct Number: [phone] Fax: [phone]

## 2022-08-01 NOTE — Telephone Encounter (Signed)
CT abdomen and pelvis for abdominal pain and bloating done with [facility] in [place] is a normal study.  Bottom of the lungs is seen on the CT abdomen, no visible cause for his left-sided lower chest pain seen.  As we had discussed, his left-sided rib cage pain is not digestive in origin.  Bloating, especially with normal bowel habits and now normal CT scan, is usually dietary in nature.  I will send him a portal message with some dietary recommendations about that.  If he is taking any probiotics, I recommend stopping them.  - HD

## 2022-08-02 NOTE — Telephone Encounter (Signed)
Patient reviewed MyChart message. Last read by Kirtland Bouchard at 11:55 AM on 08/02/2022.

## 2022-08-02 NOTE — Telephone Encounter (Signed)
MyChart message sent to patient with CT results and recommendations.

## 2022-08-04 ENCOUNTER — Other Ambulatory Visit: Payer: Self-pay

## 2022-08-04 MED ORDER — PANTOPRAZOLE SODIUM 40 MG PO TBEC: DELAYED_RELEASE_TABLET | ORAL | 0 refills | Status: DC

## 2022-09-08 ENCOUNTER — Ambulatory Visit: Payer: BC Managed Care – PPO | Admitting: Orthopaedic Surgery

## 2022-09-08 ENCOUNTER — Encounter: Payer: Self-pay | Admitting: Orthopaedic Surgery

## 2022-09-08 DIAGNOSIS — M5440 Lumbago with sciatica, unspecified side: Secondary | ICD-10-CM | POA: Diagnosis not present

## 2022-09-08 NOTE — Progress Notes (Signed)
Office Visit Note   Patient: Bryce Choi           Date of Birth: Jan 28, 1971           MRN: [id number] Visit Date: 09/08/2022              Requested by: Vanessa Roachdale, FNP [address] [place],  [place] [address] PCP: Vanessa Utqiagvik, FNP   Assessment & Plan: Visit Diagnoses:  1. Acute right-sided low back pain with sciatica, sciatica laterality unspecified     Plan: Arlys John is a pleasant 51 year old gentleman with a history of low back pain.  He originally had a laminectomy he believes around 2000.  He did very well but had return of back pain after lifting a heavy generator.  An MRI done almost a year ago  demonstrated the laminectomy at L5-S1.  He also has some degenerative changes.  He was referred for an epidural injection to [facility] in [place] near where he lives.  He had 3 injections and he reports that it completely resolved his painful symptoms.  He was able to go back to all activities.  He is now beginning to have the recurrence of some pain in his lower back.  Denies any new injury any radicular findings.  Denies any loss of bowel or bladder control.  Because of the severity of his symptoms last year he would like to have this addressed before it gets any worse.  He is having some discomfort in his low back mostly in the right para lumbar region.  No flank pain.  No radicular symptoms.  Since he did so well with the injections he is requesting a referral for repeat injections.  This will be done to [facility] in [place].  He also asked about some FMLA paperwork that he submitted today and it is in our office and will be addressed by [employer].  Follow-Up Instructions: Return if symptoms worsen or fail to improve.   Orders:  Orders Placed This Encounter  Procedures   Ambulatory referral to Physical Medicine Rehab   No orders of the defined types were placed in this encounter.     Procedures: No procedures performed   Clinical Data: No  additional findings.   Subjective: Chief Complaint  Patient presents with   Lower Back - Pain  Mr. Lia was seen about a year ago for evaluation of severe back pain.  He had an MRI scan revealing an old laminectomy at L5-S1 and some degenerative changes at L4-5 and L5-S1 and may be some foraminal stenosis.  He was complaining of pain in the lower back as well as his right calf.  He underwent a series of epidural steroid injections at [facility] he relates that he resolved his pain.  He has been able to function without any limitations but has had a little recurrence of his back pain without the referred discomfort of the lower extremity and would like to try the injections again  HPI  Review of Systems  All other systems reviewed and are negative.    Objective: Vital Signs: There were no vitals taken for this visit.  Physical Exam Constitutional:      Appearance: Normal appearance.  Pulmonary:     Effort: Pulmonary effort is normal.  Skin:    General: Skin is warm and dry.  Neurological:     Mental Status: He is alert.     Ortho Exam Examination patient is fit not in acute distress.  He does have some tenderness in his lower back.  His strength is 5 out of 5 and his sensation is intact in his bilateral lower extremities.  No pain in his hip.  Straight leg raise negative.  Motor exam intact.  He did have some decreased sensibility in his right foot a year ago but he notes that has resolved as well Specialty Comments:  No specialty comments available.  Imaging: No results found.   PMFS History: Patient Active Problem List   Diagnosis Date Noted   GERD (gastroesophageal reflux disease) 07/04/2022   Low back pain 08/26/2021   Past Medical History:  Diagnosis Date   GERD (gastroesophageal reflux disease)    Sciatica     Family History  Family history unknown: Yes    Past Surgical History:  Procedure Laterality Date   LUMBAR DISC SURGERY  2000   SHOULDER  SURGERY Left 2011   bone spurs   Social History   Occupational History   Occupation: [employer]  Tobacco Use   Smoking status: Former    Types: Cigarettes    Quit date: 2000    Years since quitting: 23.9   Smokeless tobacco: Former    Types: Chew    Quit date: 2000  Vaping Use   Vaping Use: Never used  Substance and Sexual Activity   Alcohol use: Never   Drug use: Never   Sexual activity: Not on file

## 2022-09-09 ENCOUNTER — Telehealth: Payer: Self-pay | Admitting: Orthopaedic Surgery

## 2022-09-09 NOTE — Telephone Encounter (Signed)
Patient seen 09/08/22, Dr. Cleophas Dunker addressed forms but not how long oow, please advise and provide note. [employer] forms received for disability. Thank you.

## 2022-09-09 NOTE — Telephone Encounter (Signed)
Patient called back, wants intermittent leave for 12 months, please advise if okay. Thank you

## 2022-09-09 NOTE — Telephone Encounter (Signed)
Noted for [employer] ?

## 2022-09-23 ENCOUNTER — Encounter: Payer: Self-pay | Admitting: Orthopaedic Surgery

## 2022-10-04 ENCOUNTER — Telehealth: Payer: Self-pay | Admitting: Physician Assistant

## 2022-10-04 NOTE — Telephone Encounter (Signed)
Pt spouse call stating we are send ing pain management referral to wrong fax number. Please resend referral to [phone].

## 2022-10-04 NOTE — Telephone Encounter (Signed)
Referral faxed to the corrected fax number given

## 2023-02-13 ENCOUNTER — Encounter: Payer: Self-pay | Admitting: Neurology

## 2023-02-13 ENCOUNTER — Other Ambulatory Visit: Payer: Self-pay

## 2023-02-13 DIAGNOSIS — R202 Paresthesia of skin: Secondary | ICD-10-CM

## 2023-03-02 ENCOUNTER — Encounter: Payer: BC Managed Care – PPO | Admitting: Neurology

## 2023-07-25 ENCOUNTER — Telehealth: Payer: Self-pay | Admitting: Pharmacy Technician

## 2023-07-25 ENCOUNTER — Other Ambulatory Visit (HOSPITAL_COMMUNITY): Payer: Self-pay

## 2023-07-25 NOTE — Telephone Encounter (Signed)
Pharmacy Patient Advocate Encounter   Received notification from CoverMyMeds that prior authorization for PANTOPRAZOLE 40MG  is required/requested.   Insurance verification completed.   The patient is insured through [facility] .   Per test claim: PA required; PA submitted to above mentioned insurance via CoverMyMeds Key/confirmation #/EOC [id number] Status is pending

## 2023-07-25 NOTE — Telephone Encounter (Signed)
Pharmacy Patient Advocate Encounter  Received notification from [facility] that Prior Authorization for PANTOPRAZOLE 40MG  has been APPROVED from 10.29.24 to 10.29.25. Ran test claim, Copay is $1.70. This test claim was processed through [facility]- copay amounts may vary at other pharmacies due to pharmacy/plan contracts, or as the patient moves through the different stages of their insurance plan.   PA #/Case ID/Reference #: [id number]

## 2023-10-02 IMAGING — MR MR LUMBAR SPINE W/O CM
5 series · 31 of 48 positions shown · non-contrast
Comparison: Lumbar radiographs 08/26/2021

CLINICAL DATA: Lumbar radiculopathy. Pain in right leg. Back
surgery 8222

EXAM:
MRI LUMBAR SPINE WITHOUT CONTRAST
TECHNIQUE: Multiplanar, multisequence MR imaging of the lumbar spine was
performed. No intravenous contrast was administered.

[Series 5: T2 · sagittal · 4.0mm · 0.70mm/px · 6 of 15 slices shown (1 of 2)]
[im 1/15]
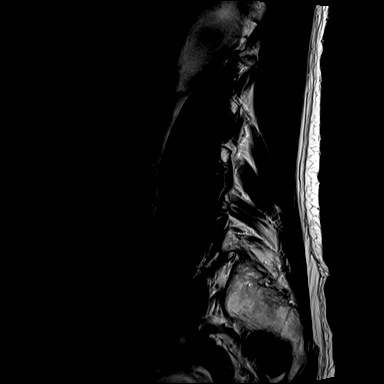
[im 3/15]
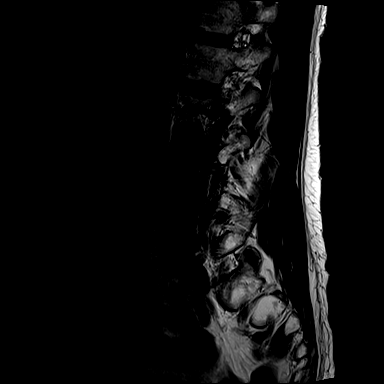
[im 6/15]
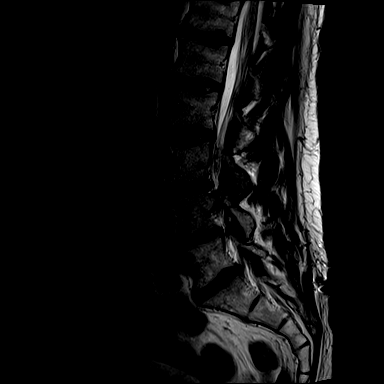
[im 9/15]
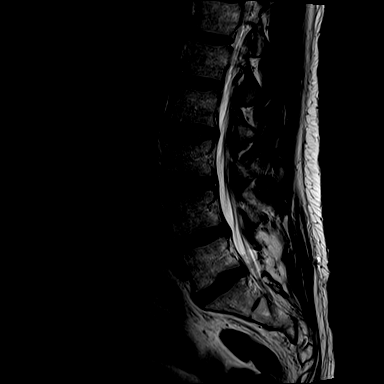
[im 12/15]
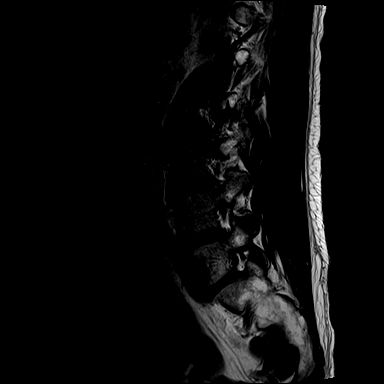
[im 15/15]
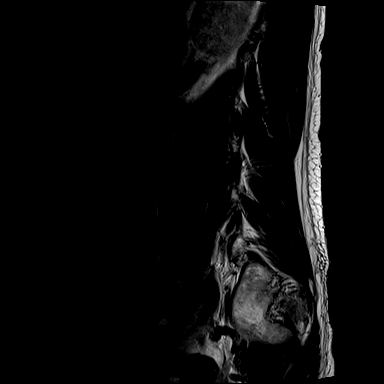

[Series 6: T1 · sagittal · 4.0mm · 0.84mm/px · 6 of 15 slices shown (1 of 2)]
[im 1/15]
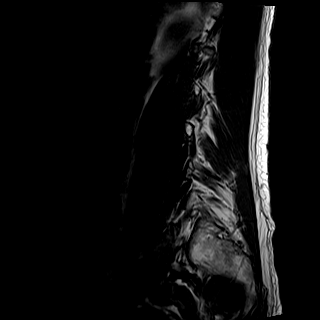
[im 3/15]
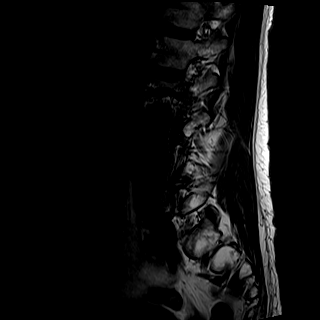
[im 6/15]
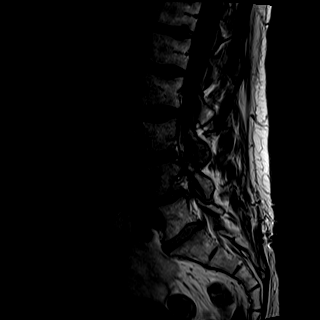
[im 9/15]
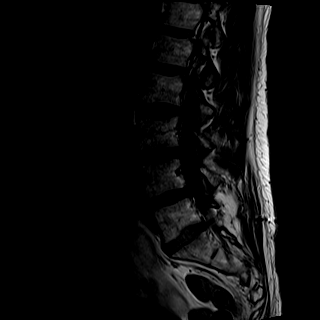
[im 12/15]
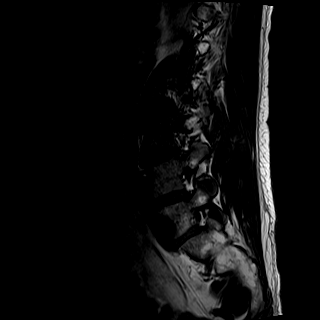
[im 15/15]
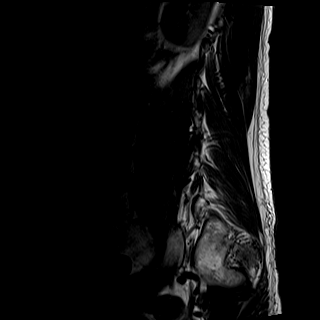

[Series 8: T2 · axial · 4.0mm · 0.70mm/px · z∈[-125,+92]mm · 9 of 39 slices shown (2 of 2)]
[im 1/39]
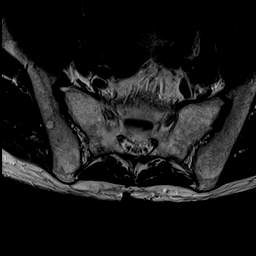
[im 6/39]
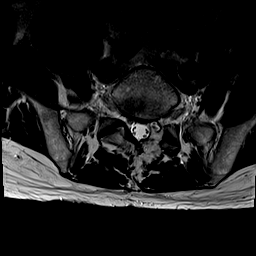
[im 11/39]
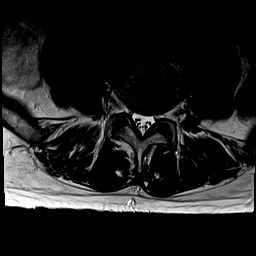
[im 17/39]
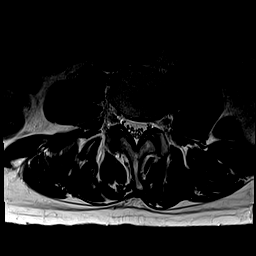
[im 20/39]
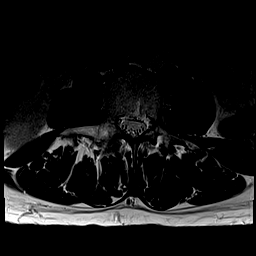
[im 22/39]
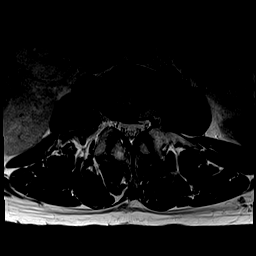
[im 28/39]
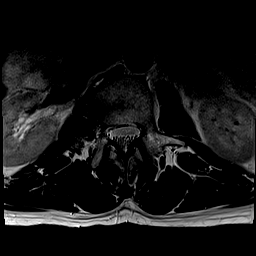
[im 33/39]
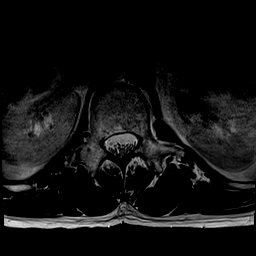
[im 39/39]
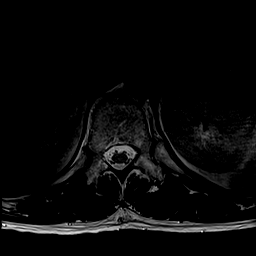

[Series 9: T1 · axial · 4.0mm · 0.35mm/px · z∈[-125,+92]mm · 9 of 39 slices shown (2 of 2)]
[im 1/39]
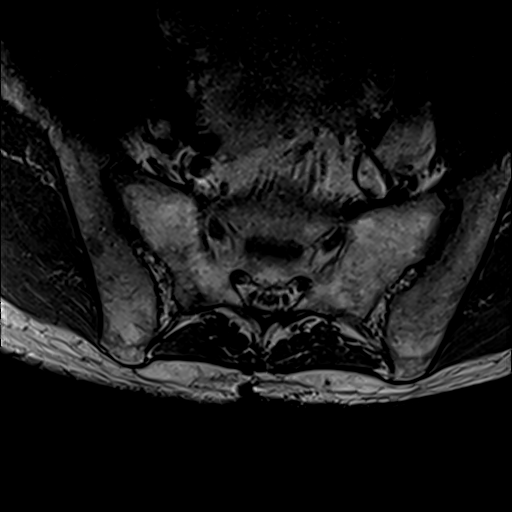
[im 6/39]
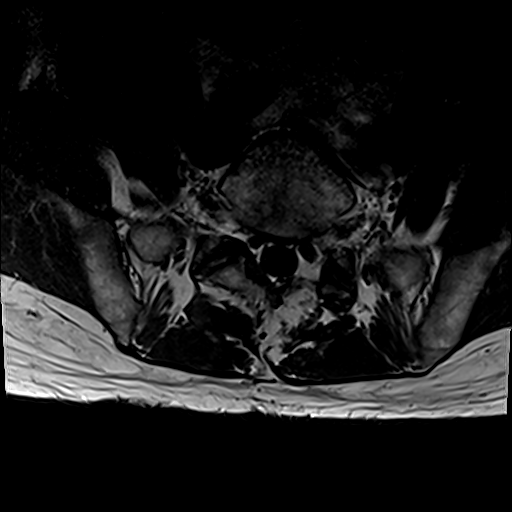
[im 11/39]
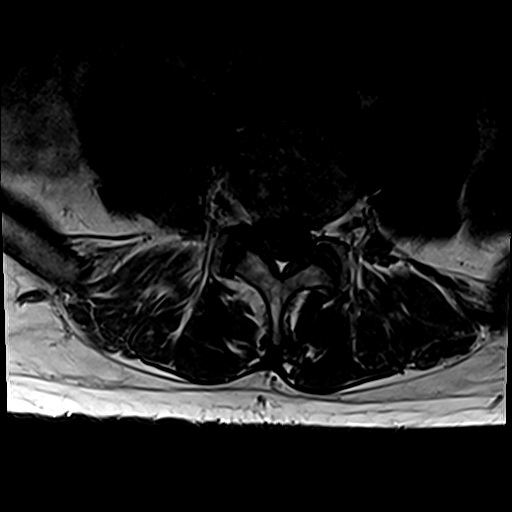
[im 17/39]
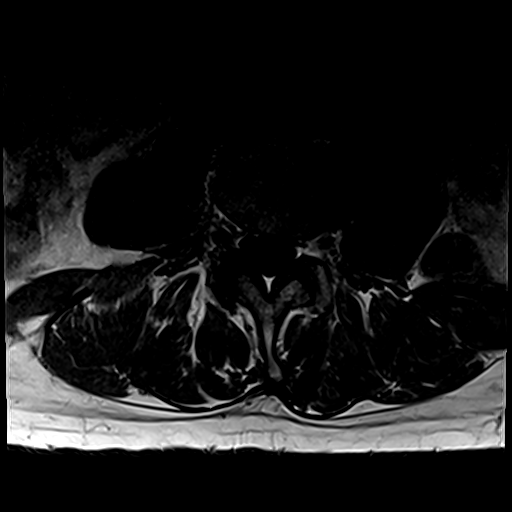
[im 20/39]
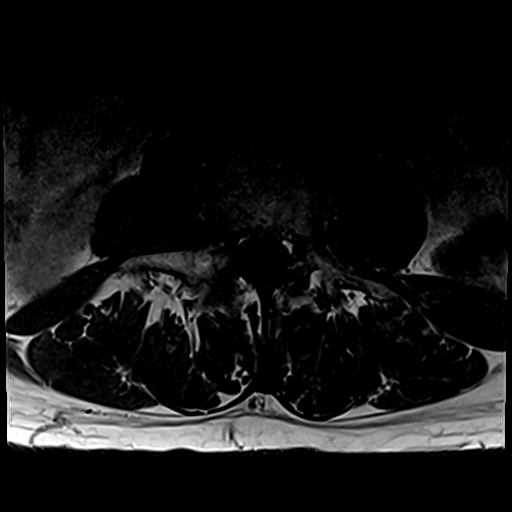
[im 22/39]
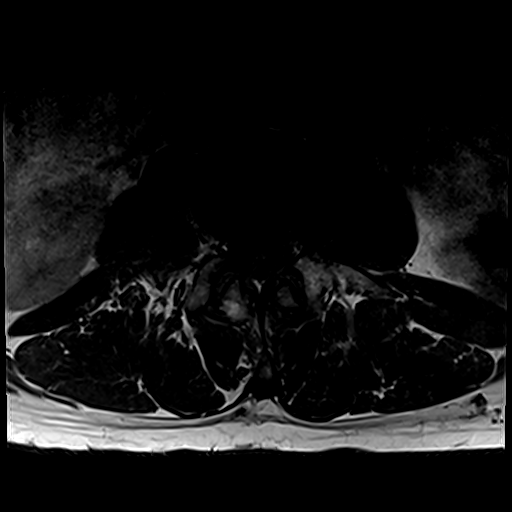
[im 28/39]
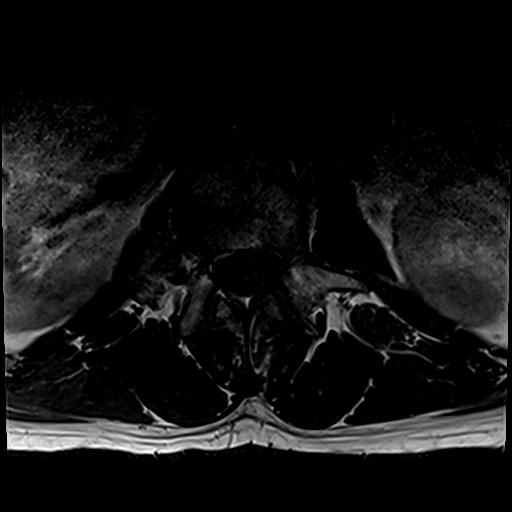
[im 33/39]
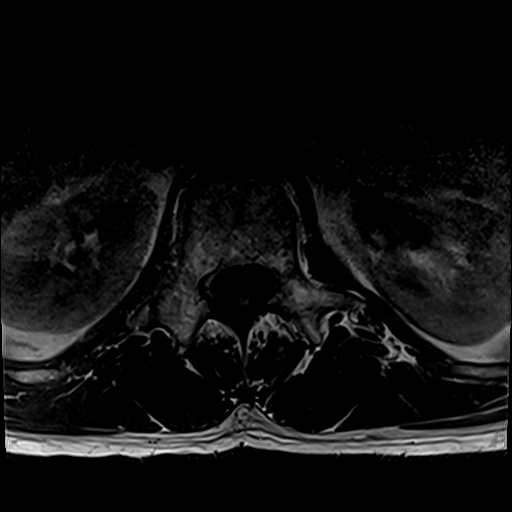
[im 39/39]
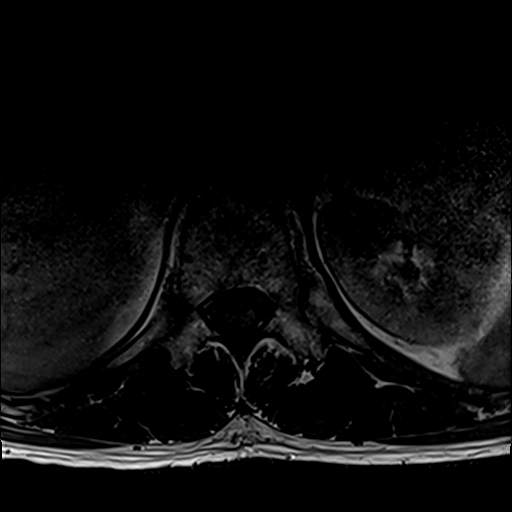

[Series 10: STIR · sagittal · 4.0mm · 0.53mm/px · 1 of 15 slices shown]
[im 1/15]
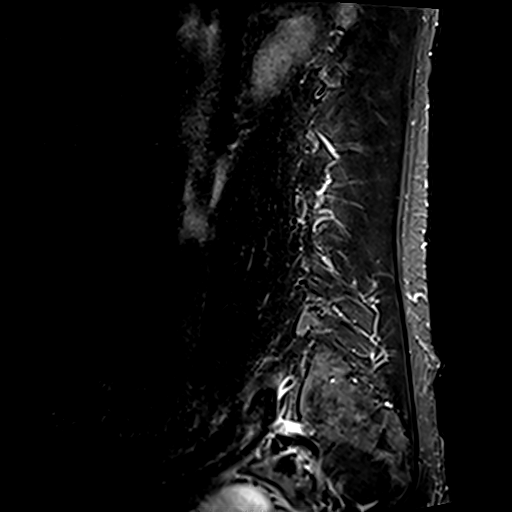

[31 of 48 positions shown; findings below may reference images not displayed]

FINDINGS: Segmentation:  5 lumbar vertebra.

Alignment:  Normal

Vertebrae:  Normal bone marrow.  Negative for fracture or mass.

Conus medullaris and cauda equina: Conus extends to the L1-2 level.
Conus and cauda equina appear normal.

Paraspinal and other soft tissues: Negative for paraspinous mass,
adenopathy, or fluid collection

Disc levels:

L1-2: Negative

L2-3: Shallow central disc protrusion and mild facet degeneration.
Negative for stenosis

L3-4: Shallow central disc protrusion and mild facet degeneration.
Negative for stenosis

L4-5: Shallow central disc protrusion. Bilateral facet hypertrophy.
Mild subarticular stenosis bilaterally without definite neural
compression

L5-S1: Left laminectomy. No recurrent disc protrusion or neural
impingement. Neural foramina widely patent bilaterally
IMPRESSION: Shallow central disc protrusions L2-3 and L3-4 without stenosis

Shallow central disc protrusion and facet hypertrophy L4-5 with mild
subarticular stenosis bilaterally

Left laminectomy L5-S1.  Negative for neural impingement.

## 2024-03-21 ENCOUNTER — Encounter: Payer: Self-pay | Admitting: Gastroenterology

## 2024-03-21 ENCOUNTER — Ambulatory Visit: Admitting: Gastroenterology

## 2024-03-21 VITALS — BP 110/64 | HR 64 | Ht 72.0 in | Wt 174.5 lb

## 2024-03-21 DIAGNOSIS — K219 Gastro-esophageal reflux disease without esophagitis: Secondary | ICD-10-CM

## 2024-03-21 DIAGNOSIS — R1012 Left upper quadrant pain: Secondary | ICD-10-CM | POA: Diagnosis not present

## 2024-03-21 DIAGNOSIS — R110 Nausea: Secondary | ICD-10-CM

## 2024-03-21 NOTE — Progress Notes (Signed)
 Chief Complaint: LUQ abdominal pain, nausea Primary GI Doctor:Dr. Legrand  HPI:  Patient is a  53  year old male patient with past medical history of GERD, presents for complaint of intermittent abd pain and nausea. Patient last seen in GI office by Elida, NP on 07/04/2022 for heartburn and abdominal bloating.EGD scheduled at that time.  07/05/2022 EGD with Dr. Legrand- - Salmon- colored mucosa. Biopsied.  - Normal stomach. Biopsied.  - Normal examined duodenum. Biopsied. Path: Small intestine and stomach biopsies normal. Esophagus biopsies with mild reflux changes, no precancerous condition.  08/01/2022 CT abdomen pelvis with contrast ordered and done with [facility] in [place]. Impression: No acute findings.    Interval History    Patient presents with main complaint of intermittent nausea with LUQ discomfort in for the past 2-3 months. He states the symptoms typically happen first thing in the morning. He is up at 330 am routinely with his dog to start the day. He eats very healthy and avoids dairy, and certain meats. 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 No alcohol use. He also reports he noted three days where his stool was darker but not tarry black. He states this has resolved. He has one bowel movement daily. No recent travel. No exposure. No new medications. He had back surgery within the past year and reports they had prescribed him percocet which he did not want to take so he switched to OTC NSAIDs. He reports he took this for several weeks, but once he recovered he stopped them about 3-4 mths ago. We discussed how this can cause stomach irritation. Patient has been taking Pantoprazole  40 mg twice daily for history of GERD.   Labs done in Spring 2025 with PCP and per patient normal.   Patient has completed 6 [place] and was very active up until his back issues. He still tries to stay active and swims.   Wt Readings from Last 3 Encounters:   03/21/24 174 lb 8 oz (79.2 kg)  07/05/22 173 lb (78.5 kg)  07/04/22 173 lb (78.5 kg)    Past Medical History:  Diagnosis Date   GERD (gastroesophageal reflux disease)    Sciatica     Past Surgical History:  Procedure Laterality Date   LUMBAR DISC SURGERY  2000   SHOULDER SURGERY Left 2011   bone spurs    Current Outpatient Medications  Medication Sig Dispense Refill   Multiple Vitamin (MULTIVITAMIN) tablet Take 1 tablet by mouth daily.     pantoprazole  (PROTONIX ) 40 MG tablet Take 1 tablet (40 mg total) by mouth 2 (two) times daily, 30 minutes before breakfast and your evening meal. 180 tablet 0   Probiotic Product (PROBIOTIC PO) Take 1 tablet by mouth daily.     Testosterone 1.62 % GEL Apply 75 g topically daily.     No current facility-administered medications for this visit.    Allergies as of 03/21/2024 - Review Complete 03/21/2024  Allergen Reaction Noted   Penicillins Rash 10/12/2021    Family History  Family history unknown: Yes    Review of Systems:    Constitutional: No weight loss, fever, chills, weakness or fatigue HEENT: Eyes: No change in vision               Ears, Nose, Throat:  No change in hearing or congestion Skin: No rash or itching Cardiovascular: No chest pain, chest pressure or palpitations   Respiratory: No SOB or cough  Gastrointestinal: See HPI and otherwise negative Genitourinary: No dysuria or change in urinary frequency Neurological: No headache, dizziness or syncope Musculoskeletal: No new muscle or joint pain Hematologic: No bleeding or bruising Psychiatric: No history of depression or anxiety    Physical Exam:  Vital signs: BP 110/64 (BP Location: Left Arm, Patient Position: Sitting, Cuff Size: Normal)   Pulse 64   Ht 6' (1.829 m)   Wt 174 lb 8 oz (79.2 kg)   BMI 23.67 kg/m   Constitutional:   Pleasant male appears to be in NAD, Well developed, Well nourished, alert and cooperative Throat: Oral cavity and pharynx without  inflammation, swelling or lesion.  Respiratory: Respirations even and unlabored. Lungs clear to auscultation bilaterally.   No wheezes, crackles, or rhonchi.  Cardiovascular: Normal S1, S2. Regular rate and rhythm. No peripheral edema, cyanosis or pallor.  Gastrointestinal:  Soft, nondistended, nontender. No rebound or guarding. Normal bowel sounds. No appreciable masses or hepatomegaly. Rectal:  Not performed.  Msk:  Symmetrical without gross deformities. Without edema, no deformity or joint abnormality.  Neurologic:  Alert and  oriented x4;  grossly normal neurologically.  Skin:   Dry and intact without significant lesions or rashes. Psychiatric: Oriented to person, place and time. Demonstrates good judgement and reason without abnormal affect or behaviors.  RELEVANT LABS AND IMAGING: CBC    Latest Ref Rng & Units 07/04/2022   10:49 AM  CBC  WBC 4.0 - 10.5 K/uL 5.5   Hemoglobin 13.0 - 17.0 g/dL 86.1   Hematocrit 60.9 - 52.0 % 41.2   Platelets 150.0 - 400.0 K/uL 163.0      CMP     Latest Ref Rng & Units 07/04/2022   10:49 AM  CMP  Glucose 70 - 99 mg/dL 91   BUN 6 - 23 mg/dL 25   Creatinine 9.59 - 1.50 mg/dL 8.93   Sodium 864 - 854 mEq/L 138   Potassium 3.5 - 5.1 mEq/L 4.1   Chloride 96 - 112 mEq/L 103   CO2 19 - 32 mEq/L 28   Calcium 8.4 - 10.5 mg/dL 9.3   Total Protein 6.0 - 8.3 g/dL 7.1   Total Bilirubin 0.2 - 1.2 mg/dL 0.6   Alkaline Phos 39 - 117 U/L 36   AST 0 - 37 U/L 19   ALT 0 - 53 U/L 19    09/04/21 cologuard negative, due 08/2024   Assessment: Encounter Diagnoses  Name Primary?   Gastroesophageal reflux disease, unspecified whether esophagitis present Yes   LUQ pain    Nausea without vomiting      53 year old male patient who presents for acute intermittent LUQ abd discomfort with nausea that typically occurs first thing in the morning on a empty stomach. Patient admits he took NSAIDs post back surgery for a period of time . He has discontinued them at least  3-4 mths ago and is on PPI therapy BID for GERD. EGD 06/2022 for GERD and bloating was unremarkable. CT scan 11/23 was unremarkable. Patient follows a very strict dietary regimen and stays active. We discussed avoiding NSAIDs due to risk of gastric ulcers. Will check diathereix H pylori stool test.  Plan: -Continue pantoprazole  40 mg twice daily  - Reinforced GERD diet, no late meals  -Avoid NSAIDs - h yplori stool diathereix    Thank you for the courtesy of this consult. Please call me with any questions or concerns.   Kallon Caylor, FNP-C [facility] 03/21/2024, 4:21 PM  Cc: Paola No  L, FNP

## 2024-03-21 NOTE — Patient Instructions (Addendum)
 No NSAIDs Strict GERD diet, no late meals Continue pantoprazole  40mg  twice daily   Your provider has ordered Diatherix stool testing for you. You have received a kit from our office today containing all necessary supplies to complete this test. Please carefully read the stool collection instructions provided in the kit before opening the accompanying materials. In addition, be sure there is a label providing your full name and date of birth on the puritan opti-swab tube that is supplied in the kit (if you do not see a label with this information on your test tube, please make us  aware before test collection!). After completing the test, you should secure the purtian tube into the specimen biohazard bag. The [facility] E-Req sheet (including date and time of specimen collection) should be placed into the outside pocket of the specimen biohazard bag and returned to the [place] lab (basement floor of [facility]) within 3 days of collection. Please make sure to give the specimen to a staff member at the lab. DO NOT leave the specimen on the counter.   If the specimen date and time (can be found in the upper right boxed portion of the sheet) are not filled out on the E-Req sheet, the test will NOT be performed.   _______________________________________________________  If your blood pressure at your visit was 140/90 or greater, please contact your primary care physician to follow up on this.  _______________________________________________________  If you are age 33 or older, your body mass index should be between 23-30. Your Body mass index is 23.67 kg/m. If this is out of the aforementioned range listed, please consider follow up with your Primary Care Provider.  If you are age 8 or younger, your body mass index should be between 19-25. Your Body mass index is 23.67 kg/m. If this is out of the aformentioned range listed, please consider follow up with your Primary  Care Provider.   ________________________________________________________  The [facility] providers would like to encourage you to use MYCHART to communicate with providers for non-urgent requests or questions.  Due to long hold times on the telephone, sending your provider a message by [facility] may be a faster and more efficient way to get a response.  Please allow 48 business hours for a response.  Please remember that this is for non-urgent requests.  _______________________________________________________  Thank you for trusting me with your gastrointestinal care. Deanna May, RNP

## 2024-03-21 NOTE — Progress Notes (Signed)
 ____________________________________________________________  Attending physician addendum:  Thank you for sending this case to me. I have reviewed the entire note and agree with the plan.  Because he is on long-term acid suppression, lets also make sure he regularly sees primary care to check levels of iron and B12 and then he takes a calcium plus vitamin D supplement daily  Victory Brand, MD  ____________________________________________________________

## 2024-03-25 ENCOUNTER — Telehealth: Payer: Self-pay

## 2024-03-25 NOTE — Telephone Encounter (Signed)
-----   Message from Cathryne PARAS May sent at 03/25/2024  8:38 AM EDT ----- Corean- Can you address below for me for Dr. Legrand patient please.  Cathryne, NP ----- Message ----- From: Legrand Victory LITTIE DOUGLAS, MD Sent: 03/21/2024   5:21 PM EDT To: Cathryne PARAS May, NP

## 2024-03-25 NOTE — Telephone Encounter (Signed)
 Pt informed of provider recommendation and states understanding. He asked about his diatherix results and states he dropped it off on Friday. Informed pt it has not been resulted yet and we will contact him once it has been. Pt has no further questions at this time.     Electronically signed:  Corean Amsterdam, NCMA

## 2024-04-12 ENCOUNTER — Other Ambulatory Visit (HOSPITAL_COMMUNITY): Payer: Self-pay

## 2024-04-12 ENCOUNTER — Telehealth: Payer: Self-pay

## 2024-04-12 ENCOUNTER — Other Ambulatory Visit: Payer: Self-pay

## 2024-04-12 MED ORDER — ESOMEPRAZOLE MAGNESIUM 40 MG PO CPDR: 40.0000 mg | DELAYED_RELEASE_CAPSULE | Freq: Two times a day (BID) | ORAL | 2 refills | Status: DC

## 2024-04-12 NOTE — Telephone Encounter (Signed)
 Pharmacy Patient Advocate Encounter   Received notification from CoverMyMeds that prior authorization for Esomeprazole Magnesium 40MG  dr capsules is required/requested.   Insurance verification completed.   The patient is insured through [facility] .   Per test claim: PA required; PA submitted to above mentioned insurance via CoverMyMeds Key/confirmation #/EOC [id number] Status is pending

## 2024-04-15 NOTE — Telephone Encounter (Signed)
 Pharmacy Patient Advocate Encounter  Received notification from [employer] that Prior Authorization for Esomeprazole  Magnesium  40MG  dr capsules has been DENIED.  Full denial letter will be uploaded to the media tab. See denial reason below.  Per your health plan's criteria, this drug is covered if you meet the following:  (1) Your doctor provides medical records (for example: chart notes) showing you cannot use (experienced intolerance, for example: allergy to excipient) the covered (formulary equivalent) drug with the same active ingredient: Esomeprazole  magnesium  or Nexium  (2) There are paid claims or your doctor provides medical records (for example: chart notes) showing you have tried and failed at least one more covered (formulary equivalent) drugs within the same drug class. Covered drugs include:  (A) Lansoprazole capsule (B) Omeprazole capsule (C) Rabeprazole  PA #/Case ID/Reference #: [id number]

## 2024-04-16 ENCOUNTER — Telehealth: Payer: Self-pay | Admitting: Pharmacist

## 2024-04-16 NOTE — Telephone Encounter (Signed)
 An E-Appeal has been submitted for esomeprazole . Will advise when response is received, please be advised that most companies may take 30 days to make a decision. Appeal letter and supporting documentation have been uploaded and submitted via the [facility] website.  Thank you, Devere Pandy, PharmD Clinical Pharmacist  [place]  Direct Dial: [phone]

## 2024-04-19 NOTE — Telephone Encounter (Signed)
 Inbound call from [address] requesting to speak to Bryce Choi may or her nurse in regards to scheduling a peer to peer review for esomeprazole . Provided case number [id number] Call back number is [phone], and requested to be called today before 6 pm

## 2024-04-19 NOTE — Telephone Encounter (Signed)
 Returned call to [facility] & advised them that the provider is currently out of office and will need to review if she'd like to do a peer to peer, or send in alternative medication.

## 2024-04-22 ENCOUNTER — Other Ambulatory Visit (HOSPITAL_COMMUNITY): Payer: Self-pay

## 2024-04-22 NOTE — Telephone Encounter (Signed)
 See 7/22 TE for alternatives

## 2024-04-22 NOTE — Telephone Encounter (Signed)
 Pharmacy Patient Advocate Encounter  Received notification from [employer] that Prior Authorization for Esomeprazole  Mag 40MG  Dr capsules has been DENIED.  Full denial letter will be uploaded to the media tab. See denial reason below.  Your doctor's notes must show you have tried and failed ONE MORE preferred medicine or you cannot take (due to contraindication or intolerance) ALL the preferred medicines (formulary alternatives within the same therapeutic class).   Esomepra Mag Cap 40mg  DR will be approved based on the following criteria:  (1) There are paid claims or your doctor provides medical records (for example: chart notes) showing you have tried at least three covered drugs or cannot use at all covered drugs. (A) Lansoprazole capsule, (B) Omeprazole capsule, (C) Rabeprazole , (D) Pantoprazole  (2) One of the following (A)Both of the following:  (I) The drug is being used for a FDA-approved indication (II) Additional requirements listed have been met  (B) The use of this drug is supported by information from plan approved medical resources (the drug meets the off-label administrative guideline criteria)  PA #/Case ID/Reference #: [id number]

## 2024-04-25 MED ORDER — RABEPRAZOLE SODIUM 20 MG PO TBEC: 20.0000 mg | DELAYED_RELEASE_TABLET | Freq: Every day | ORAL | 0 refills | Status: DC

## 2024-04-25 NOTE — Addendum Note (Signed)
 Addended by: Kedar Sedano H on: 04/25/2024 09:01 AM   Modules accepted: Orders

## 2024-07-01 ENCOUNTER — Other Ambulatory Visit: Payer: Self-pay | Admitting: Gastroenterology

## 2024-10-29 ENCOUNTER — Ambulatory Visit: Admitting: Gastroenterology

## 2024-11-06 ENCOUNTER — Ambulatory Visit: Admitting: Gastroenterology
